# Patient Record
Sex: Male | Born: 1995 | Race: Black or African American | Hispanic: No | Marital: Single | Smoking: Never smoker
Health system: Southern US, Community
[De-identification: ages and names within clinical notes are randomized; demographics above are authoritative.]

## PROBLEM LIST (undated history)

## (undated) HISTORY — PX: WRIST SURGERY: SHX841

---
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
## 2017-07-29 ENCOUNTER — Ambulatory Visit: Payer: Medicaid Other | Admitting: Internal Medicine

## 2017-08-08 ENCOUNTER — Encounter: Payer: Medicaid Other | Admitting: Internal Medicine

## 2017-11-01 ENCOUNTER — Encounter: Payer: Medicaid Other | Admitting: Family Medicine

## 2017-12-11 ENCOUNTER — Encounter: Payer: Medicaid Other | Admitting: Family Medicine

## 2018-05-13 ENCOUNTER — Other Ambulatory Visit: Payer: Self-pay

## 2018-05-13 ENCOUNTER — Ambulatory Visit (INDEPENDENT_AMBULATORY_CARE_PROVIDER_SITE_OTHER): Payer: Medicaid Other | Admitting: Family Medicine

## 2018-05-13 ENCOUNTER — Other Ambulatory Visit (HOSPITAL_COMMUNITY)
Admission: RE | Admit: 2018-05-13 | Discharge: 2018-05-13 | Disposition: A | Discharge status: Home / Self Care | Payer: Medicaid Other | Source: Ambulatory Visit | Attending: Family Medicine | Admitting: Family Medicine

## 2018-05-13 VITALS — BP 100/54 | HR 53 | Wt 155.1 lb

## 2018-05-13 DIAGNOSIS — Z202 Contact with and (suspected) exposure to infections with a predominantly sexual mode of transmission: Secondary | ICD-10-CM | POA: Insufficient documentation

## 2018-05-13 LAB — POCT URINALYSIS DIP (MANUAL ENTRY)
Bilirubin, UA: NEGATIVE
Glucose, UA: NEGATIVE mg/dL
Ketones, POC UA: NEGATIVE mg/dL
Leukocytes, UA: NEGATIVE
Nitrite, UA: NEGATIVE
Protein Ur, POC: NEGATIVE mg/dL
RBC UA: NEGATIVE
SPEC GRAV UA: 1.025 (ref 1.010–1.025)
Urobilinogen, UA: 1 E.U./dL
pH, UA: 7 (ref 5.0–8.0)

## 2018-05-13 NOTE — Progress Notes (Signed)
Acute Office Visit  Subjective:    Patient ID: Adam Rose, male    DOB: [birth year], 23 y.o.   MRN: [id number]  Chief Complaint  Patient presents with  . std screening    Pt partner tested + for chlamydia. No S/S  Exposure to STD  The patient's pertinent negatives include no genital injury, genital itching, genital lesions, pelvic pain, penile discharge, penile pain, priapism, scrotal swelling or testicular pain. This is a new problem. The current episode started in the past 7 days. Episode frequency: none. The problem has been unchanged. The patient is experiencing no pain. Pertinent negatives include no abdominal pain, anorexia, chest pain, chills, constipation, coughing, diarrhea, discolored urine, dysuria, fever, flank pain, frequency, headaches, hematuria, hesitancy, joint pain, joint swelling, nausea, painful intercourse, rash, shortness of breath, sore throat, urgency, urinary retention or vomiting. Nothing aggravates the symptoms. He has tried nothing for the symptoms. The treatment provided no relief. He is sexually active. He inconsistently uses condoms. Yes, his partner has an STD. There is no history of BPH, chlamydia, cryptorchidism, erectile aid use, erectile dysfunction, a femoral hernia, gonorrhea, herpes simplex, HIV, an inguinal hernia, kidney stones, prostatitis, sickle cell disease, syphilis or varicocele.     No past medical history on file.  No past surgical history on file.  Family History  Problem Relation Age of Onset  . Hypertension Mother   . Diabetes Maternal Grandfather   . Migraines Paternal Grandmother     Social History   Socioeconomic History  . Marital status: Single    Spouse name: Not on file  . Number of children: Not on file  . Years of education: Not on file  . Highest education level: Not on file  Occupational History  . Not on file  Social Needs  . Financial resource strain: Not on file  . Food insecurity:    Worry:  Not on file    Inability: Not on file  . Transportation needs:    Medical: Not on file    Non-medical: Not on file  Tobacco Use  . Smoking status: Never Smoker  . Smokeless tobacco: Never Used  Substance and Sexual Activity  . Alcohol use: No  . Drug use: No  . Sexual activity: Never  Lifestyle  . Physical activity:    Days per week: Not on file    Minutes per session: Not on file  . Stress: Not on file  Relationships  . Social connections:    Talks on phone: Not on file    Gets together: Not on file    Attends religious service: Not on file    Active member of club or organization: Not on file    Attends meetings of clubs or organizations: Not on file    Relationship status: Not on file  . Intimate partner violence:    Fear of current or ex partner: Not on file    Emotionally abused: Not on file    Physically abused: Not on file    Forced sexual activity: Not on file  Other Topics Concern  . Not on file  Social History Narrative   Lives with mom and dad and brothers Sarita Bottom and J'veon    Outpatient Medications Prior to Visit  Medication Sig Dispense Refill  . ibuprofen (ADVIL,MOTRIN) 400 MG tablet Take 1 tablet (400 mg total) by mouth daily as needed for pain (headache). 30 tablet 0  . meloxicam (MOBIC) 15 MG tablet Take 1 tablet (15 mg total) by mouth  daily. 30 tablet 1   No facility-administered medications prior to visit.     No Known Allergies  Review of Systems  Constitutional: Negative for chills and fever.  HENT: Negative for sore throat.   Respiratory: Negative for cough and shortness of breath.   Cardiovascular: Negative for chest pain.  Gastrointestinal: Negative for abdominal pain, anorexia, constipation, diarrhea, nausea and vomiting.  Genitourinary: Negative for discharge, dysuria, flank pain, frequency, hesitancy, pelvic pain, penile pain, scrotal swelling, testicular pain and urgency.  Musculoskeletal: Negative for joint pain.  Skin: Negative for  rash.  Neurological: Negative for headaches.       Objective:   BP (!) 100/54   Pulse (!) 53   Wt 155 lb 2 oz (70.4 kg)   SpO2 97%  Wt Readings from Last 3 Encounters:  05/13/18 155 lb 2 oz (70.4 kg)  11/16/15 160 lb (72.6 kg) (69 %, Z= 0.49)*  11/14/15 157 lb 3.2 oz (71.3 kg) (65 %, Z= 0.39)*   * Growth percentiles are based on CDC (Boys, 2-20 Years) data.   Gen: NAD, resting comfortably CV: RRR with no murmurs appreciated Pulm: NWOB, CTAB with no crackles, wheezes, or rhonchi GI: Normal bowel sounds present. Soft, Nontender, Nondistended. MSK: no edema, cyanosis, or clubbing noted Skin: warm, dry Neuro: grossly normal, moves all extremities Psych: Normal affect and thought content  Health Maintenance Due  Topic Date Due  . HIV Screening  01/16/2013  . TETANUS/TDAP  01/16/2017  . INFLUENZA VACCINE  09/26/2017    There are no preventive care reminders to display for this patient.   No results found for: TSH No results found for: WBC, HGB, HCT, MCV, PLT No results found for: NA, K, CHLORIDE, CO2, GLUCOSE, BUN, CREATININE, BILITOT, ALKPHOS, AST, ALT, PROT, ALBUMIN, CALCIUM, ANIONGAP, EGFR, GFR No results found for: CHOL No results found for: HDL No results found for: LDLCALC No results found for: TRIG No results found for: CHOLHDL No results found for: HGBA1C     Assessment & Plan:   Problem List Items Addressed This Visit    None    Visit Diagnoses    Exposure to STD    -  Primary   Relevant Orders   HIV antibody (with reflex)   RPR   Possible exposure to STD       Relevant Orders   Urine cytology ancillary only   POCT urinalysis dipstick (Completed)     Asymptomatic with patient with possible exposure to chlamydia.  Will do full spectrum STI testing including GC/chlamydia, HIV, RPR.  UA was negative for UTI.  Will call patient with results.  Encouraged to have a safe sex practices with condoms.  No orders of the defined types were placed in this  encounter.    Bonnita Hollow, MD

## 2018-05-13 NOTE — Patient Instructions (Signed)
It was a pleasure to see you today! Thank you for choosing [facility] for your primary care. Adam Rose was seen for STI check.   We tested you for gonorrhea, chlamydia, urinary tract infection, syphilis, and HIV. You do not likely have a UTI. Your results will be back within 24 - 48 hours. We will call you with the results. Always practice safe sex using condoms to prevent further spread of STIs.    Best,  Thomes Dinning, MD, MS FAMILY MEDICINE RESIDENT - PGY2 05/13/2018 10:26 AM

## 2018-05-14 LAB — URINE CYTOLOGY ANCILLARY ONLY
CHLAMYDIA, DNA PROBE: NEGATIVE
NEISSERIA GONORRHEA: NEGATIVE
TRICH (WINDOWPATH): NEGATIVE

## 2018-05-14 LAB — HIV ANTIBODY (ROUTINE TESTING W REFLEX): HIV Screen 4th Generation wRfx: NONREACTIVE

## 2018-05-14 LAB — RPR: RPR Ser Ql: NONREACTIVE

## 2018-05-15 ENCOUNTER — Telehealth: Payer: Self-pay

## 2018-05-15 ENCOUNTER — Encounter: Payer: Self-pay | Admitting: Family Medicine

## 2018-05-15 NOTE — Telephone Encounter (Signed)
Patient called and was given negative STD testing results.  Ples Specter, RN [facility] RN)

## 2018-12-20 ENCOUNTER — Other Ambulatory Visit: Payer: Self-pay

## 2018-12-20 ENCOUNTER — Ambulatory Visit (HOSPITAL_COMMUNITY)
Admission: EM | Admit: 2018-12-20 | Discharge: 2018-12-20 | Disposition: A | Discharge status: Home / Self Care | Payer: Medicaid Other | Attending: Family Medicine | Admitting: Family Medicine

## 2018-12-20 ENCOUNTER — Encounter (HOSPITAL_COMMUNITY): Payer: Self-pay | Admitting: Emergency Medicine

## 2018-12-20 DIAGNOSIS — K0889 Other specified disorders of teeth and supporting structures: Secondary | ICD-10-CM

## 2018-12-20 MED ORDER — IBUPROFEN 800 MG PO TABS: 800.0000 mg | ORAL_TABLET | Freq: Three times a day (TID) | ORAL | 0 refills | Status: DC

## 2018-12-20 MED ORDER — PENICILLIN V POTASSIUM 500 MG PO TABS: 500.0000 mg | ORAL_TABLET | Freq: Three times a day (TID) | ORAL | 0 refills | Status: DC

## 2018-12-20 NOTE — ED Provider Notes (Signed)
[facility]   [id number] 12/20/18 Arrival Time: Van PLAN:  1. Pain, dental     No sign of abscess requiring I&D at this time. Discussed.  To begin: Meds ordered this encounter  Medications  . penicillin v potassium (VEETID) 500 MG tablet    Sig: Take 1 tablet (500 mg total) by mouth 3 (three) times daily.    Dispense:  30 tablet    Refill:  0  . ibuprofen (ADVIL) 800 MG tablet    Sig: Take 1 tablet (800 mg total) by mouth 3 (three) times daily with meals.    Dispense:  21 tablet    Refill:  0   Dental resource written instructions given. He will schedule dental evaluation as soon as possible if not improving over the next 24-48 hours.  Reviewed expectations re: course of current medical issues. Questions answered. Outlined signs and symptoms indicating need for more acute intervention. Patient verbalized understanding. After Visit Summary given.   SUBJECTIVE:  Adam Rose is a 23 y.o. male who reports gradual onset of left lower dental pain described as aching/throbbing . Present for 3-4 days. Fever: absent. Tolerating PO intake but reports pain with chewing. Normal swallowing. He does not see a dentist regularly; "but I was supposed to have my wisdom teeth removed before COVID-19". No neck swelling or pain. OTC analgesics without relief.  ROS: As per HPI.  OBJECTIVE: Vitals:   12/20/18 1047  BP: 126/90  Pulse: 76  Resp: 18  Temp: 99.8 F (37.7 C)  TempSrc: Oral  SpO2: 100%    General appearance: alert; no distress HENT: normocephalic; atraumatic; dentition: fair; left lower gums without areas of fluctuance, drainage, or bleeding and with tenderness to palpation; normal jaw movement without difficulty Neck: supple without LAD; FROM; trachea midline Lungs: normal respirations; unlabored Skin: warm and dry Psychological: alert and cooperative; normal mood and affect  No Known Allergies   Social History   Socioeconomic  History  . Marital status: Single    Spouse name: Not on file  . Number of children: Not on file  . Years of education: Not on file  . Highest education level: Not on file  Occupational History  . Not on file  Social Needs  . Financial resource strain: Not on file  . Food insecurity    Worry: Not on file    Inability: Not on file  . Transportation needs    Medical: Not on file    Non-medical: Not on file  Tobacco Use  . Smoking status: Never Smoker  . Smokeless tobacco: Never Used  Substance and Sexual Activity  . Alcohol use: No  . Drug use: No  . Sexual activity: Never  Lifestyle  . Physical activity    Days per week: Not on file    Minutes per session: Not on file  . Stress: Not on file  Relationships  . Social Herbalist on phone: Not on file    Gets together: Not on file    Attends religious service: Not on file    Active member of club or organization: Not on file    Attends meetings of clubs or organizations: Not on file    Relationship status: Not on file  . Intimate partner violence    Fear of current or ex partner: Not on file    Emotionally abused: Not on file    Physically abused: Not on file    Forced sexual activity: Not  on file  Other Topics Concern  . Not on file  Social History Narrative   Lives with mom and dad and brothers [place] and [place]   Family History  Problem Relation Age of Onset  . Hypertension Mother   . Diabetes Maternal Grandfather   . Migraines Paternal Grandmother    No past surgical history on file.   Mardella Layman, MD 12/20/18 1100

## 2018-12-20 NOTE — ED Triage Notes (Signed)
Complains of dental pain for 4 days.  Patient has history of dental issues.  Bottom, left tooth hurting and swelling to left jaw and under jaw .

## 2019-06-09 ENCOUNTER — Other Ambulatory Visit: Payer: Self-pay

## 2019-06-09 ENCOUNTER — Encounter (HOSPITAL_COMMUNITY): Payer: Self-pay

## 2019-06-09 ENCOUNTER — Ambulatory Visit (HOSPITAL_COMMUNITY)
Admission: EM | Admit: 2019-06-09 | Discharge: 2019-06-09 | Disposition: A | Discharge status: Home / Self Care | Payer: Medicaid Other | Attending: Family Medicine | Admitting: Family Medicine

## 2019-06-09 DIAGNOSIS — S20212A Contusion of left front wall of thorax, initial encounter: Secondary | ICD-10-CM

## 2019-06-09 MED ORDER — NAPROXEN 500 MG PO TABS: 500.0000 mg | ORAL_TABLET | Freq: Two times a day (BID) | ORAL | 0 refills | Status: DC

## 2019-06-09 NOTE — Discharge Instructions (Signed)
Naprosyn twice daily with food for pain/swelling Alternate ice and heat Avoid heavy lifting while healing  Please follow up if developing increased pain, shortness of breath

## 2019-06-09 NOTE — ED Triage Notes (Signed)
Pt states he was playing football on Saturday and another player fell on top of pt and player's elbow struck into pt's upper left chest area. Pt c/o ongoing chest 'soreness'. Denies SOB, n/v, diaphoresis or other complaint. States pain does not change with respiration but increases with physical exertion.

## 2019-06-09 NOTE — ED Provider Notes (Signed)
[facility]    CSN: [id number] Arrival date & time: 06/09/19  0813      History   Chief Complaint Chief Complaint  Patient presents with  . Fall  . Chest Pain    HPI Adam Rose is a 24 y.o. male presenting today for evaluation of left-sided chest pain.  Patient notes that on Saturday he was playing football, as him and another player were going for the same ball they fell to the ground and another player's elbow struck him in his left chest.  Had a lot of pain initially, but has had moderate improvement since.  Denies any shortness of breath or difficulty breathing.  Denies hitting head or loss of consciousness.  He has continued to feel soreness.  Feels symptoms worsen when at work with doing physical exertion.  He expresses concern as his mom recently passed away from an MI and also had CHF.  He did personally denies any history of heart disease.  Denies cocaine use.  HPI  History reviewed. No pertinent past medical history.  Patient Active Problem List   Diagnosis Date Noted  . Left ankle pain 11/14/2015  . Routine sports physical exam 08/26/2013  . Headache, temporal 02/05/2012  . WARTS, MULTIPLE 10/25/2006    Past Surgical History:  Procedure Laterality Date  . WRIST SURGERY         Home Medications    Prior to Admission medications   Medication Sig Start Date End Date Taking? Authorizing Provider  ibuprofen (ADVIL) 800 MG tablet Take 1 tablet (800 mg total) by mouth 3 (three) times daily with meals. 12/20/18  Yes Mardella Layman, MD  naproxen (NAPROSYN) 500 MG tablet Take 1 tablet (500 mg total) by mouth 2 (two) times daily. 06/09/19   Cutberto Winfree, Junius Creamer, PA-C    Family History Family History  Problem Relation Age of Onset  . Hypertension Mother   . Diabetes Maternal Grandfather   . Migraines Paternal Grandmother     Social History Social History   Tobacco Use  . Smoking status: Never Smoker  . Smokeless tobacco: Never Used   Substance Use Topics  . Alcohol use: No  . Drug use: No     Allergies   Patient has no known allergies.   Review of Systems Review of Systems  Constitutional: Negative for fatigue and fever.  Eyes: Negative for redness, itching and visual disturbance.  Respiratory: Negative for shortness of breath.   Cardiovascular: Positive for chest pain. Negative for leg swelling.  Gastrointestinal: Negative for nausea and vomiting.  Musculoskeletal: Positive for myalgias. Negative for arthralgias.  Skin: Negative for color change, rash and wound.  Neurological: Negative for dizziness, syncope, weakness, light-headedness and headaches.     Physical Exam Triage Vital Signs ED Triage Vitals [06/09/19 0840]  Enc Vitals Group     BP (!) 145/79     Pulse Rate 62     Resp 16     Temp 98.5 F (36.9 C)     Temp Source Oral     SpO2 100 %     Weight      Height      Head Circumference      Peak Flow      Pain Score 5     Pain Loc      Pain Edu?      Excl. in GC?    No data found.  Updated Vital Signs BP (!) 145/79 (BP Location: Right Arm)   Pulse 62  Temp 98.5 F (36.9 C) (Oral)   Resp 16   SpO2 100%   Visual Acuity Right Eye Distance:   Left Eye Distance:   Bilateral Distance:    Right Eye Near:   Left Eye Near:    Bilateral Near:     Physical Exam Vitals and nursing note reviewed.  Constitutional:      Appearance: He is well-developed.     Comments: No acute distress  HENT:     Head: Normocephalic and atraumatic.     Nose: Nose normal.  Eyes:     Conjunctiva/sclera: Conjunctivae normal.  Cardiovascular:     Rate and Rhythm: Normal rate.  Pulmonary:     Effort: Pulmonary effort is normal. No respiratory distress.     Comments: Breathing comfortably at rest, CTABL, no wheezing, rales or other adventitious sounds auscultated  Left anterior chest tender to palpation near left sternal border Abdominal:     General: There is no distension.  Musculoskeletal:         General: Normal range of motion.     Cervical back: Neck supple.  Skin:    General: Skin is warm and dry.     Comments: No overlying bruising or discoloration noted  Neurological:     Mental Status: He is alert and oriented to person, place, and time.      UC Treatments / Results  Labs (all labs ordered are listed, but only abnormal results are displayed) Labs Reviewed - No data to display  EKG   Radiology No results found.  Procedures Procedures (including critical care time)  Medications Ordered in UC Medications - No data to display  Initial Impression / Assessment and Plan / UC Course  I have reviewed the triage vital signs and the nursing notes.  Pertinent labs & imaging results that were available during my care of the patient were reviewed by me and considered in my medical decision making (see chart for details).     EKG sinus bradycardia at 52, no acute signs of ischemia or infarction, does have isolated nonspecific T wave inversion in lead III.  Pain reproducible with palpation, most likely MSK cause.  Has had moderate improvement over the past 3 days with minimal pleuritic pain.  Most likely contusion/muscular etiology rather than underlying rib fracture.  Offered x-ray, but there is shared decision making opted to continue treatment with anti-inflammatories and monitoring for gradual improvement over the next week.  Provided work note for no heavy lifting while area healing.  Discussed strict return precautions. Patient verbalized understanding and is agreeable with plan.  Final Clinical Impressions(s) / UC Diagnoses   Final diagnoses:  Contusion of left chest wall, initial encounter     Discharge Instructions     Naprosyn twice daily with food for pain/swelling Alternate ice and heat Avoid heavy lifting while healing  Please follow up if developing increased pain, shortness of breath   ED Prescriptions    Medication Sig Dispense Auth.  Provider   naproxen (NAPROSYN) 500 MG tablet Take 1 tablet (500 mg total) by mouth 2 (two) times daily. 30 tablet Merian Wroe, Eden C, PA-C     PDMP not reviewed this encounter.   Janith Lima, [place] 06/09/19 [phone]

## 2019-06-11 ENCOUNTER — Telehealth (HOSPITAL_COMMUNITY): Payer: Self-pay

## 2019-06-19 ENCOUNTER — Other Ambulatory Visit: Payer: Self-pay

## 2019-06-19 ENCOUNTER — Ambulatory Visit (HOSPITAL_COMMUNITY)
Admission: EM | Admit: 2019-06-19 | Discharge: 2019-06-19 | Discharge status: Against Medical Advice | Payer: Medicaid Other

## 2019-06-19 NOTE — ED Notes (Signed)
Pt left out of building and did not return

## 2019-10-12 ENCOUNTER — Ambulatory Visit
Admission: EM | Admit: 2019-10-12 | Discharge: 2019-10-12 | Disposition: A | Discharge status: Home / Self Care | Payer: Medicaid Other | Attending: Physician Assistant | Admitting: Physician Assistant

## 2019-10-12 DIAGNOSIS — Z1152 Encounter for screening for COVID-19: Secondary | ICD-10-CM | POA: Diagnosis not present

## 2019-10-12 NOTE — Discharge Instructions (Signed)

## 2019-10-12 NOTE — ED Triage Notes (Signed)
Pt states needs a covid test to start his new job. No sx's or exposure. Denies covid vaccines.

## 2019-10-13 LAB — NOVEL CORONAVIRUS, NAA: SARS-CoV-2, NAA: NOT DETECTED

## 2019-10-13 LAB — SARS-COV-2, NAA 2 DAY TAT

## 2020-01-03 ENCOUNTER — Other Ambulatory Visit: Payer: Self-pay

## 2020-01-03 ENCOUNTER — Encounter (HOSPITAL_COMMUNITY): Payer: Self-pay | Admitting: Emergency Medicine

## 2020-01-03 ENCOUNTER — Ambulatory Visit (HOSPITAL_COMMUNITY)
Admission: EM | Admit: 2020-01-03 | Discharge: 2020-01-03 | Disposition: A | Discharge status: Home / Self Care | Payer: Medicaid Other | Attending: Family Medicine | Admitting: Family Medicine

## 2020-01-03 DIAGNOSIS — K0889 Other specified disorders of teeth and supporting structures: Secondary | ICD-10-CM | POA: Diagnosis not present

## 2020-01-03 MED ORDER — IBUPROFEN 800 MG PO TABS: 800.0000 mg | ORAL_TABLET | Freq: Three times a day (TID) | ORAL | 0 refills | Status: DC

## 2020-01-03 MED ORDER — HYDROCODONE-ACETAMINOPHEN 7.5-325 MG PO TABS: 1.0000 | ORAL_TABLET | Freq: Four times a day (QID) | ORAL | 0 refills | Status: DC | PRN

## 2020-01-03 MED ORDER — PENICILLIN V POTASSIUM 500 MG PO TABS: 500.0000 mg | ORAL_TABLET | Freq: Four times a day (QID) | ORAL | 0 refills | Status: AC

## 2020-01-03 NOTE — ED Triage Notes (Signed)
Pt presents with left sided mouth/jaw pain and headache xs 3 days.

## 2020-01-03 NOTE — Discharge Instructions (Signed)
Take antibiotic as directed Take ibuprofen 3 times a day with food.  Take this for moderate pain. Take hydrocodone for severe pain.  Do not drive on hydrocodone Call tomorrow to set up a dental follow-up

## 2020-01-03 NOTE — ED Provider Notes (Signed)
[facility]    CSN: [id number] Arrival date & time: 01/03/20  1137      History   Chief Complaint Chief Complaint  Patient presents with  . Dental Pain    HPI Adam Rose is a 24 y.o. male.   HPI  Patient has a known dental fracture.  He now has dental pain has been increasing for the last 2 days.  He states he was unable to sleep last night secondary to pain in spite of taking Tylenol and ibuprofen.  No fever.  He does have pain with chewing on the left side.  History reviewed. No pertinent past medical history.  Patient Active Problem List   Diagnosis Date Noted  . Left ankle pain 11/14/2015  . Routine sports physical exam 08/26/2013  . Headache, temporal 02/05/2012  . WARTS, MULTIPLE 10/25/2006    Past Surgical History:  Procedure Laterality Date  . WRIST SURGERY         Home Medications    Prior to Admission medications   Medication Sig Start Date End Date Taking? Authorizing Provider  HYDROcodone-acetaminophen (NORCO) 7.5-325 MG tablet Take 1 tablet by mouth every 6 (six) hours as needed for moderate pain. 01/03/20   Eustace Moore, MD  ibuprofen (ADVIL) 800 MG tablet Take 1 tablet (800 mg total) by mouth 3 (three) times daily with meals. 01/03/20   Eustace Moore, MD  penicillin v potassium (VEETID) 500 MG tablet Take 1 tablet (500 mg total) by mouth 4 (four) times daily for 7 days. 01/03/20 01/10/20  Eustace Moore, MD    Family History Family History  Problem Relation Age of Onset  . Hypertension Mother   . Diabetes Maternal Grandfather   . Migraines Paternal Grandmother     Social History Social History   Tobacco Use  . Smoking status: Never Smoker  . Smokeless tobacco: Never Used  Vaping Use  . Vaping Use: Never used  Substance Use Topics  . Alcohol use: No  . Drug use: No     Allergies   Patient has no known allergies.   Review of Systems Review of Systems See HPI  Physical Exam Triage Vital  Signs ED Triage Vitals  Enc Vitals Group     BP 01/03/20 1251 121/76     Pulse Rate 01/03/20 1251 79     Resp 01/03/20 1251 16     Temp 01/03/20 1251 98.9 F (37.2 C)     Temp Source 01/03/20 1251 Oral     SpO2 01/03/20 1251 100 %     Weight --      Height --      Head Circumference --      Peak Flow --      Pain Score 01/03/20 1249 8     Pain Loc --      Pain Edu? --      Excl. in GC? --    No data found.  Updated Vital Signs BP 121/76 (BP Location: Left Arm)   Pulse 79   Temp 98.9 F (37.2 C) (Oral)   Resp 16   SpO2 100%       Physical Exam Constitutional:      General: He is not in acute distress.    Appearance: He is well-developed.  HENT:     Head: Normocephalic and atraumatic.     Mouth/Throat:   Eyes:     Conjunctiva/sclera: Conjunctivae normal.     Pupils: Pupils are equal, round, and reactive  to light.  Cardiovascular:     Rate and Rhythm: Normal rate.  Pulmonary:     Effort: Pulmonary effort is normal. No respiratory distress.  Abdominal:     General: There is no distension.     Palpations: Abdomen is soft.  Musculoskeletal:        General: Normal range of motion.     Cervical back: Normal range of motion.  Skin:    General: Skin is warm and dry.  Neurological:     Mental Status: He is alert.      UC Treatments / Results  Labs (all labs ordered are listed, but only abnormal results are displayed) Labs Reviewed - No data to display  EKG   Radiology No results found.  Procedures Procedures (including critical care time)  Medications Ordered in UC Medications - No data to display  Initial Impression / Assessment and Plan / UC Course  I have reviewed the triage vital signs and the nursing notes.  Pertinent labs & imaging results that were available during my care of the patient were reviewed by me and considered in my medical decision making (see chart for details).     Final Clinical Impressions(s) / UC Diagnoses   Final  diagnoses:  Pain, dental     Discharge Instructions     Take antibiotic as directed Take ibuprofen 3 times a day with food.  Take this for moderate pain. Take hydrocodone for severe pain.  Do not drive on hydrocodone Call tomorrow to set up a dental follow-up   ED Prescriptions    Medication Sig Dispense Auth. Provider   penicillin v potassium (VEETID) 500 MG tablet Take 1 tablet (500 mg total) by mouth 4 (four) times daily for 7 days. 28 tablet Eustace Moore, MD   HYDROcodone-acetaminophen [facility]) 7.5-325 MG tablet Take 1 tablet by mouth every 6 (six) hours as needed for moderate pain. 15 tablet Eustace Moore, MD   ibuprofen (ADVIL) 800 MG tablet Take 1 tablet (800 mg total) by mouth 3 (three) times daily with meals. 21 tablet Eustace Moore, MD     I have reviewed the PDMP during this encounter.   Eustace Moore, MD 01/03/20 1309

## 2020-06-29 ENCOUNTER — Ambulatory Visit (HOSPITAL_COMMUNITY)
Admission: EM | Admit: 2020-06-29 | Discharge: 2020-06-29 | Disposition: A | Discharge status: Home / Self Care | Payer: Medicaid Other | Attending: Urgent Care | Admitting: Urgent Care

## 2020-06-29 ENCOUNTER — Encounter (HOSPITAL_COMMUNITY): Payer: Self-pay | Admitting: Emergency Medicine

## 2020-06-29 ENCOUNTER — Other Ambulatory Visit: Payer: Self-pay

## 2020-06-29 DIAGNOSIS — L03011 Cellulitis of right finger: Secondary | ICD-10-CM | POA: Diagnosis not present

## 2020-06-29 DIAGNOSIS — M79644 Pain in right finger(s): Secondary | ICD-10-CM

## 2020-06-29 MED ORDER — NAPROXEN 500 MG PO TABS: 500.0000 mg | ORAL_TABLET | Freq: Two times a day (BID) | ORAL | 0 refills | Status: DC

## 2020-06-29 MED ORDER — DOXYCYCLINE HYCLATE 100 MG PO CAPS: 100.0000 mg | ORAL_CAPSULE | Freq: Two times a day (BID) | ORAL | 0 refills | Status: DC

## 2020-06-29 NOTE — ED Notes (Signed)
Soaking finger in warm soapy water

## 2020-06-29 NOTE — Discharge Instructions (Signed)
Please use warm soapy soaks. Press on your wound to try to get pus out if you can tolerate it. You have declined an incision and drainage procedure today but if you do not get better in 48 hours, you will need this.

## 2020-06-29 NOTE — ED Triage Notes (Signed)
Patient has pain in right index finger.  Patient has pain, pus  and swelling to cuticle

## 2020-06-29 NOTE — ED Provider Notes (Signed)
[facility]   MRN: [id number] DOB: Mar 17, 1995  Subjective:   Adam Rose is a 25 y.o. male presenting for 1 week history of right index finger pain about the ulnar aspect of the cuticle bordering the nail.  Patient has had a history of a paronychia before including an incision and drainage.  Does not want to have this done today at all.  States that the pain is too severe.  Would like to try an antibiotic course.  Denies fever, nausea, vomiting, red streaks along his hand.   No current facility-administered medications for this encounter.  Current Outpatient Medications:  .  HYDROcodone-acetaminophen (NORCO) 7.5-325 MG tablet, Take 1 tablet by mouth every 6 (six) hours as needed for moderate pain. (Patient not taking: Reported on 06/29/2020), Disp: 15 tablet, Rfl: 0 .  ibuprofen (ADVIL) 800 MG tablet, Take 1 tablet (800 mg total) by mouth 3 (three) times daily with meals. (Patient not taking: Reported on 06/29/2020), Disp: 21 tablet, Rfl: 0   No Known Allergies  History reviewed. No pertinent past medical history.   Past Surgical History:  Procedure Laterality Date  . WRIST SURGERY      Family History  Problem Relation Age of Onset  . Hypertension Mother   . Diabetes Maternal Grandfather   . Migraines Paternal Grandmother     Social History   Tobacco Use  . Smoking status: Never Smoker  . Smokeless tobacco: Never Used  Vaping Use  . Vaping Use: Never used  Substance Use Topics  . Alcohol use: No  . Drug use: No    ROS   Objective:   Vitals: BP 118/64 (BP Location: Left Arm)   Pulse (!) 50   Temp 98.5 F (36.9 C) (Oral)   Resp 18   SpO2 100%   Physical Exam Constitutional:      General: He is not in acute distress.    Appearance: Normal appearance. He is well-developed and normal weight. He is not ill-appearing, toxic-appearing or diaphoretic.  HENT:     Head: Normocephalic and atraumatic.     Right Ear: External ear normal.      Left Ear: External ear normal.     Nose: Nose normal.     Mouth/Throat:     Pharynx: Oropharynx is clear.  Eyes:     General: No scleral icterus.       Right eye: No discharge.        Left eye: No discharge.     Extraocular Movements: Extraocular movements intact.     Pupils: Pupils are equal, round, and reactive to light.  Cardiovascular:     Rate and Rhythm: Normal rate.  Pulmonary:     Effort: Pulmonary effort is normal.  Musculoskeletal:       Hands:     Cervical back: Normal range of motion.  Neurological:     Mental Status: He is alert and oriented to person, place, and time.  Psychiatric:        Mood and Affect: Mood normal.        Behavior: Behavior normal.        Thought Content: Thought content normal.        Judgment: Judgment normal.       Assessment and Plan :   PDMP not reviewed this encounter.  1. Paronychia of finger of right hand   2. Finger pain, right     Patient refused x3 incision and drainage for treatment of his paronychia.  Discussed possible complications of an untreated paronychia.  He is of sound mind and would like to trial antibiotic with pain control. Counseled patient on potential for adverse effects with medications prescribed/recommended today, strict ER and return-to-clinic precautions discussed, patient verbalized understanding.    Wallis Bamberg, [place] 06/29/20 1614

## 2020-07-01 ENCOUNTER — Encounter (HOSPITAL_COMMUNITY): Payer: Self-pay

## 2020-07-01 ENCOUNTER — Other Ambulatory Visit: Payer: Self-pay

## 2020-07-01 ENCOUNTER — Ambulatory Visit (HOSPITAL_COMMUNITY)
Admission: EM | Admit: 2020-07-01 | Discharge: 2020-07-01 | Disposition: A | Discharge status: Home / Self Care | Payer: Medicaid Other | Attending: Emergency Medicine | Admitting: Emergency Medicine

## 2020-07-01 DIAGNOSIS — L03011 Cellulitis of right finger: Secondary | ICD-10-CM

## 2020-07-01 NOTE — Discharge Instructions (Addendum)
Continue antibiotic course as prescribed  Do warm soaks with finger at least 4 times a day to help facilitate drainage, warm up cup of water and hold finger into a cup for about 10 minutes  Follow-up for fever, chills, increased pain, increased swelling if swelling moves into palm side of finger or if decreased movement begins

## 2020-07-01 NOTE — ED Triage Notes (Signed)
Pt in with c/o right index finger swelling that has bee going on for 1 week  Pt has been taking prescribed antibiotics with no relief from pain

## 2020-07-01 NOTE — ED Provider Notes (Signed)
[facility]    CSN: [id number] Arrival date & time: 07/01/20  1331      History   Chief Complaint Chief Complaint  Patient presents with  . finger swelling    HPI Adam Rose is a 25 y.o. male.   Patient presents for reevaluation of right pointer finger after requesting delayed Incision and drainage on 06/29/20. Patient bites nails and pulled hang nail from site. Finger has been swollen and painful for 1 week. ROM intact. Denies numbness and tingling. Taking antibiotics as prescribed.  History reviewed. No pertinent past medical history.  Patient Active Problem List   Diagnosis Date Noted  . Left ankle pain 11/14/2015  . Routine sports physical exam 08/26/2013  . Headache, temporal 02/05/2012  . WARTS, MULTIPLE 10/25/2006    Past Surgical History:  Procedure Laterality Date  . WRIST SURGERY         Home Medications    Prior to Admission medications   Medication Sig Start Date End Date Taking? Authorizing Provider  doxycycline (VIBRAMYCIN) 100 MG capsule Take 1 capsule (100 mg total) by mouth 2 (two) times daily. 06/29/20   Wallis Bamberg, PA-C  HYDROcodone-acetaminophen (NORCO) 7.5-325 MG tablet Take 1 tablet by mouth every 6 (six) hours as needed for moderate pain. Patient not taking: Reported on 06/29/2020 01/03/20   Eustace Moore, MD  ibuprofen (ADVIL) 800 MG tablet Take 1 tablet (800 mg total) by mouth 3 (three) times daily with meals. Patient not taking: Reported on 06/29/2020 01/03/20   Eustace Moore, MD  naproxen (NAPROSYN) 500 MG tablet Take 1 tablet (500 mg total) by mouth 2 (two) times daily with a meal. 06/29/20   Wallis Bamberg, PA-C    Family History Family History  Problem Relation Age of Onset  . Hypertension Mother   . Diabetes Maternal Grandfather   . Migraines Paternal Grandmother     Social History Social History   Tobacco Use  . Smoking status: Never Smoker  . Smokeless tobacco: Never Used  Vaping Use  . Vaping Use:  Never used  Substance Use Topics  . Alcohol use: No  . Drug use: No     Allergies   Patient has no known allergies.   Review of Systems Review of Systems  Constitutional: Negative.   Respiratory: Negative.   Cardiovascular: Negative.   Skin: Positive for wound. Negative for color change, pallor and rash.  Neurological: Negative.      Physical Exam Triage Vital Signs ED Triage Vitals  Enc Vitals Group     BP 07/01/20 1420 (!) 111/59     Pulse Rate 07/01/20 1420 65     Resp 07/01/20 1420 17     Temp 07/01/20 1420 98.5 F (36.9 C)     Temp src --      SpO2 07/01/20 1420 97 %     Weight --      Height --      Head Circumference --      Peak Flow --      Pain Score 07/01/20 1419 10     Pain Loc --      Pain Edu? --      Excl. in GC? --    No data found.  Updated Vital Signs BP (!) 111/59   Pulse 65   Temp 98.5 F (36.9 C)   Resp 17   SpO2 97%   Visual Acuity Right Eye Distance:   Left Eye Distance:   Bilateral Distance:  Right Eye Near:   Left Eye Near:    Bilateral Near:     Physical Exam Constitutional:      Appearance: Normal appearance. He is normal weight.  HENT:     Head: Normocephalic.  Eyes:     Extraocular Movements: Extraocular movements intact.  Pulmonary:     Effort: Pulmonary effort is normal.  Musculoskeletal:        General: Normal range of motion.     Cervical back: Normal range of motion.  Skin:    Comments: Swelling and tenderness present around the base of finger nail and ulnar side of finger, ROM intact, does not appear to have tendon or joint involvement, sensation intact   Neurological:     General: No focal deficit present.     Mental Status: He is alert and oriented to person, place, and time. Mental status is at baseline.  Psychiatric:        Mood and Affect: Mood normal.        Behavior: Behavior normal.        Thought Content: Thought content normal.        Judgment: Judgment normal.      UC Treatments /  Results  Labs (all labs ordered are listed, but only abnormal results are displayed) Labs Reviewed - No data to display  EKG   Radiology No results found.  Procedures Incision and Drainage  Date/Time: 07/01/2020 3:32 PM Performed by: Valinda Hoar, NP Authorized by: Valinda Hoar, NP   Consent:    Consent obtained:  Verbal   Consent given by:  Patient   Risks, benefits, and alternatives were discussed: yes     Risks discussed:  Bleeding, pain and infection   Alternatives discussed:  No treatment Universal protocol:    Procedure explained and questions answered to patient or proxy's satisfaction: yes     Patient identity confirmed:  Verbally with patient Location:    Type:  Abscess   Location: right pointer finger. Pre-procedure details:    Skin preparation:  Chlorhexidine with alcohol Sedation:    Sedation type:  None Anesthesia:    Anesthesia method:  Local infiltration   Local anesthetic:  Lidocaine 1% w/o epi Procedure type:    Complexity:  Simple Procedure details:    Incision types:  Single straight   Drainage:  Purulent and bloody   Drainage amount:  Moderate   Wound treatment:  Wound left open   Packing materials:  None Post-procedure details:    Procedure completion:  Tolerated   (including critical care time)  Medications Ordered in UC Medications - No data to display  Initial Impression / Assessment and Plan / UC Course  I have reviewed the triage vital signs and the nursing notes.  Pertinent labs & imaging results that were available during my care of the patient were reviewed by me and considered in my medical decision making (see chart for details).  Paronychia of finger of right hand  1. Finish course of antibiotics as prescribed 2. Warm soaks at least 4 times a day  3. Strict return precautions given, verbalized understanding  Final Clinical Impressions(s) / UC Diagnoses   Final diagnoses:  Paronychia of finger of right hand      Discharge Instructions     Continue antibiotic course as prescribed  Do warm soaks with finger at least 4 times a day to help facilitate drainage, warm up cup of water and hold finger into a cup for about 10 minutes  Follow-up for fever, chills, increased pain, increased swelling if swelling moves into palm side of finger or if decreased movement begins   ED Prescriptions    None     PDMP not reviewed this encounter.   Valinda Hoar, [place] 07/01/20 [phone]

## 2023-09-23 ENCOUNTER — Encounter (HOSPITAL_COMMUNITY): Payer: Self-pay

## 2023-09-23 ENCOUNTER — Ambulatory Visit (HOSPITAL_COMMUNITY)
Admission: EM | Admit: 2023-09-23 | Discharge: 2023-09-23 | Disposition: A | Discharge status: Home / Self Care | Payer: Self-pay

## 2023-09-23 DIAGNOSIS — S61412A Laceration without foreign body of left hand, initial encounter: Secondary | ICD-10-CM

## 2023-09-23 DIAGNOSIS — Z23 Encounter for immunization: Secondary | ICD-10-CM

## 2023-09-23 MED ORDER — BACITRACIN ZINC 500 UNIT/GM EX OINT
TOPICAL_OINTMENT | Freq: Once | CUTANEOUS | Status: AC
  Administered 2023-09-23: 1 via TOPICAL

## 2023-09-23 MED ORDER — TETANUS-DIPHTH-ACELL PERTUSSIS 5-2.5-18.5 LF-MCG/0.5 IM SUSY
PREFILLED_SYRINGE | INTRAMUSCULAR | Status: AC
  Filled 2023-09-23: qty 0.5

## 2023-09-23 MED ORDER — TETANUS-DIPHTH-ACELL PERTUSSIS 5-2.5-18.5 LF-MCG/0.5 IM SUSY
0.5000 mL | PREFILLED_SYRINGE | Freq: Once | INTRAMUSCULAR | Status: AC
  Administered 2023-09-23: 0.5 mL via INTRAMUSCULAR

## 2023-09-23 MED ORDER — LIDOCAINE HCL 2 % IJ SOLN
INTRAMUSCULAR | Status: AC
  Filled 2023-09-23: qty 20

## 2023-09-23 MED ORDER — BACITRACIN ZINC 500 UNIT/GM EX OINT
TOPICAL_OINTMENT | CUTANEOUS | Status: AC
  Filled 2023-09-23: qty 0.9

## 2023-09-23 NOTE — ED Triage Notes (Signed)
 Pt states cut his rt hand above middle finger on concrete while playing basketball today. Unknown tetanus.

## 2023-09-23 NOTE — Discharge Instructions (Addendum)
  1. Laceration of left hand without foreign body, initial encounter (Primary) - Wound care (Clean Wound) prior to laceration repair. - Tdap (BOOSTRIX ) injection 0.5 mL given in UC for tetanus prevention - Apply dressing for wound protection - bacitracin  ointment - Laceration Repair by UC provider with 7 sutures in place with close approximation - Return to urgent care in 7 to 10 days for further evaluation and suture removal.

## 2023-09-23 NOTE — ED Provider Notes (Signed)
 [facility]  Note:  This document was prepared using Dragon voice recognition software and may include unintentional dictation errors.  MRN: [id number] DOB: 1996-02-14  Subjective:   Adam Rose is a 28 y.o. male presenting for a dorsal hand laceration that occurred approximately an hour before arrival to urgent care.  Patient reports that he was playing basketball when he fell landing on the concrete causing laceration to the back of his hand.  Patient does not know last time he had a tetanus booster.  Patient is able to move all fingers with increased pain but all tendons seem to be intact.  Wound is gaping and moderate to large amount of bleeding noted.  No current facility-administered medications for this encounter. No current outpatient medications on file.   No Known Allergies  History reviewed. No pertinent past medical history.   Past Surgical History:  Procedure Laterality Date   WRIST SURGERY      Family History  Problem Relation Age of Onset   Hypertension Mother    Diabetes Maternal Grandfather    Migraines Paternal Grandmother     Social History   Tobacco Use   Smoking status: Never   Smokeless tobacco: Never  Vaping Use   Vaping status: Never Used  Substance Use Topics   Alcohol use: No   Drug use: No    ROS Refer to HPI for ROS details.  Objective:   Vitals: BP 116/79 (BP Location: Right Arm)   Pulse 77   Temp 98.6 F (37 C) (Oral)   Resp 16   SpO2 99%   Physical Exam Vitals and nursing note reviewed.  Constitutional:      General: He is not in acute distress.    Appearance: He is well-developed. He is not ill-appearing or toxic-appearing.  HENT:     Head: Normocephalic.  Cardiovascular:     Rate and Rhythm: Normal rate.  Pulmonary:     Effort: Pulmonary effort is normal. No respiratory distress.  Skin:    General: Skin is warm and dry.     Findings: Laceration present.  Neurological:     General: No  focal deficit present.     Mental Status: He is alert and oriented to person, place, and time.  Psychiatric:        Mood and Affect: Mood normal.        Behavior: Behavior normal.     Laceration Repair  Date/Time: 09/23/2023 8:18 PM  Performed by: Aurea Ethel NOVAK, NP Authorized by: Aurea Ethel NOVAK, NP   Consent:    Consent obtained:  Verbal   Consent given by:  Patient   Risks discussed:  Infection and pain Universal protocol:    Patient identity confirmed:  Verbally with patient and arm band Anesthesia:    Anesthesia method:  Local infiltration   Local anesthetic:  Lidocaine  2% w/o epi Laceration details:    Location:  Hand   Hand location:  L hand, dorsum   Length (cm):  3   Depth (mm):  5 Pre-procedure details:    Preparation:  Patient was prepped and draped in usual sterile fashion Treatment:    Area cleansed with:  Shur-Clens and soap and water   Amount of cleaning:  Standard Skin repair:    Repair method:  Sutures   Suture size:  5-0   Suture material:  Prolene   Number of sutures:  7 Approximation:    Approximation:  Close Repair type:    Repair type:  Simple  Post-procedure details:    Dressing:  Antibiotic ointment and non-adherent dressing   Procedure completion:  Tolerated well, no immediate complications   No results found for this or any previous visit (from the past 24 hours).  No results found.   Assessment and Plan :     Discharge Instructions       1. Laceration of left hand without foreign body, initial encounter (Primary) - Wound care (Clean Wound) prior to laceration repair. - Tdap (BOOSTRIX ) injection 0.5 mL given in UC for tetanus prevention - Apply dressing for wound protection - bacitracin  ointment - Laceration Repair by UC provider with 7 sutures in place with close approximation - Return to urgent care in 7 to 10 days for further evaluation and suture removal.        Ethel KATHEE Aurea Aurea, Dyesha Henault B,  NP 09/23/23 2043

## 2023-09-30 ENCOUNTER — Ambulatory Visit (HOSPITAL_COMMUNITY)
Admission: EM | Admit: 2023-09-30 | Discharge: 2023-09-30 | Disposition: A | Discharge status: Home / Self Care | Payer: Self-pay | Attending: Family Medicine | Admitting: Family Medicine

## 2023-09-30 ENCOUNTER — Ambulatory Visit (INDEPENDENT_AMBULATORY_CARE_PROVIDER_SITE_OTHER): Payer: Self-pay

## 2023-09-30 ENCOUNTER — Other Ambulatory Visit: Payer: Self-pay

## 2023-09-30 ENCOUNTER — Encounter (HOSPITAL_COMMUNITY): Payer: Self-pay | Admitting: Emergency Medicine

## 2023-09-30 DIAGNOSIS — Z4802 Encounter for removal of sutures: Secondary | ICD-10-CM

## 2023-09-30 DIAGNOSIS — M79641 Pain in right hand: Secondary | ICD-10-CM

## 2023-09-30 MED ORDER — IBUPROFEN 800 MG PO TABS: 800.0000 mg | ORAL_TABLET | Freq: Three times a day (TID) | ORAL | 0 refills | Status: AC | PRN

## 2023-09-30 NOTE — Discharge Instructions (Signed)
 There are no fractures on the xrays of your hand.  Take ibuprofen  800 mg--1 tab every 8 hours as needed for pain.  Please see the orthopedic hand specialist

## 2023-09-30 NOTE — ED Notes (Signed)
 Swelling present around sutures limited movement .  After removal of dressing, patient requested having a provider look at wound .

## 2023-09-30 NOTE — ED Triage Notes (Signed)
 Patient is here for suture removal. Patient was seen at [facility] on 7/28 and sutures placed then.

## 2023-09-30 NOTE — ED Notes (Signed)
 Patient denies questions for provider, denies concerns

## 2023-09-30 NOTE — ED Notes (Signed)
 Per note 7 sutures dorsum.

## 2023-09-30 NOTE — ED Provider Notes (Signed)
 [facility]    CSN: [id number] Arrival date & time: 09/30/23  0904      History   Chief Complaint Chief Complaint  Patient presents with   Appointment    9:00   requests physician   Medication Refill    HPI Adam Rose is a 28 y.o. male.  Here for suture removal.  He was here on July 28 for a laceration and injury.  He fell onto a concrete surface with his hand.  The area is still swollen a good bit and is hurting a little bit.  No fever or chills or drainage from the laceration.  NKDA  No x-ray was done at the time of the laceration repair.   History reviewed. No pertinent past medical history.  Patient Active Problem List   Diagnosis Date Noted   Left ankle pain 11/14/2015   Routine sports physical exam 08/26/2013   Headache, temporal 02/05/2012   Viral warts 10/25/2006    Past Surgical History:  Procedure Laterality Date   WRIST SURGERY         Home Medications    Prior to Admission medications   Medication Sig Start Date End Date Taking? Authorizing Provider  ibuprofen  (ADVIL ) 800 MG tablet Take 1 tablet (800 mg total) by mouth every 8 (eight) hours as needed (pain). 09/30/23  Yes Vonna Sharlet POUR, MD    Family History Family History  Problem Relation Age of Onset   Hypertension Mother    Diabetes Maternal Grandfather    Migraines Paternal Grandmother     Social History Social History   Tobacco Use   Smoking status: Never   Smokeless tobacco: Never  Vaping Use   Vaping status: Never Used  Substance Use Topics   Alcohol use: No   Drug use: No     Allergies   Patient has no known allergies.   Review of Systems Review of Systems   Physical Exam Triage Vital Signs ED Triage Vitals  Encounter Vitals Group     BP 09/30/23 0928 (!) 107/57     Girls Systolic BP Percentile --      Girls Diastolic BP Percentile --      Boys Systolic BP Percentile --      Boys Diastolic BP Percentile --      Pulse Rate  09/30/23 0928 (!) 52     Resp 09/30/23 0928 18     Temp 09/30/23 0928 97.9 F (36.6 C)     Temp Source 09/30/23 0928 Oral     SpO2 09/30/23 0928 96 %     Weight --      Height --      Head Circumference --      Peak Flow --      Pain Score 09/30/23 0926 7     Pain Loc --      Pain Education --      Exclude from Growth Chart --    No data found.  Updated Vital Signs BP (!) 107/57 (BP Location: Left Arm)   Pulse (!) 52   Temp 97.9 F (36.6 C) (Oral)   Resp 18   SpO2 96%   Visual Acuity Right Eye Distance:   Left Eye Distance:   Bilateral Distance:    Right Eye Near:   Left Eye Near:    Bilateral Near:     Physical Exam Vitals reviewed.  Musculoskeletal:     Comments: There is swelling over the dorsum of the third MCP of  the left hand.  He has difficulty flexing and extending at the MCP joints.  Skin:    Coloration: Skin is not jaundiced or pale.     Comments: Suture line is well-healed.  Neurological:     General: No focal deficit present.     Mental Status: He is alert and oriented to person, place, and time.      UC Treatments / Results  Labs (all labs ordered are listed, but only abnormal results are displayed) Labs Reviewed - No data to display  EKG   Radiology DG Hand Complete Right Result Date: 09/30/2023 CLINICAL DATA:  Right hand pain in the region of the 3rd MCP joint with associated swelling after a fall on concrete on 09/23/2023. EXAM: RIGHT HAND - COMPLETE 3+ VIEW COMPARISON:  None Available. FINDINGS: Dorsal soft tissue swelling at the level of the MCP joints. No fracture, dislocation or radiopaque foreign body. No soft tissue gas. IMPRESSION: Dorsal soft tissue swelling. No fracture. Electronically Signed   By: Elspeth Bathe M.D.   On: 09/30/2023 10:50    Procedures Procedures (including critical care time)  Medications Ordered in UC Medications - No data to display  Initial Impression / Assessment and Plan / UC Course  I have reviewed the  triage vital signs and the nursing notes.  Pertinent labs & imaging results that were available during my care of the patient were reviewed by me and considered in my medical decision making (see chart for details).   There is no fracture where it is swollen.  I did send him a new prescription of ibuprofen  and gave him hand specialist contact info.  Staff remove the sutures Final Clinical Impressions(s) / UC Diagnoses   Final diagnoses:  Right hand pain  Visit for suture removal     Discharge Instructions      There are no fractures on the xrays of your hand.  Take ibuprofen  800 mg--1 tab every 8 hours as needed for pain.  Please see the orthopedic hand specialist     ED Prescriptions     Medication Sig Dispense Auth. Provider   ibuprofen  (ADVIL ) 800 MG tablet Take 1 tablet (800 mg total) by mouth every 8 (eight) hours as needed (pain). 21 tablet Cornel Werber K, MD      PDMP not reviewed this encounter.   Vonna Sharlet POUR, MD 09/30/23 2019

## 2023-10-01 ENCOUNTER — Encounter (HOSPITAL_COMMUNITY): Payer: Self-pay | Admitting: Emergency Medicine
